# Patient Record
Sex: Male | Born: 1970 | Race: White | Hispanic: No | Marital: Married
Health system: Southern US, Community
[De-identification: ages and names within clinical notes are randomized; demographics above are authoritative.]

---

## 2020-06-08 ENCOUNTER — Other Ambulatory Visit: Payer: Self-pay | Admitting: Gastroenterology

## 2020-06-08 DIAGNOSIS — F1010 Alcohol abuse, uncomplicated: Secondary | ICD-10-CM

## 2020-06-08 DIAGNOSIS — R7401 Elevation of levels of liver transaminase levels: Secondary | ICD-10-CM

## 2020-06-15 ENCOUNTER — Ambulatory Visit
Admission: RE | Admit: 2020-06-15 | Discharge: 2020-06-15 | Disposition: A | Discharge status: Home / Self Care | Payer: BC Managed Care – PPO | Source: Ambulatory Visit | Attending: Gastroenterology | Admitting: Gastroenterology

## 2020-06-15 ENCOUNTER — Other Ambulatory Visit: Payer: Self-pay

## 2020-06-15 DIAGNOSIS — F1010 Alcohol abuse, uncomplicated: Secondary | ICD-10-CM | POA: Insufficient documentation

## 2020-06-15 DIAGNOSIS — R7401 Elevation of levels of liver transaminase levels: Secondary | ICD-10-CM | POA: Diagnosis present

## 2021-10-18 IMAGING — US US ABDOMEN LIMITED
1 series · 14 of 25 positions shown · non-contrast
Comparison: None.

CLINICAL DATA: Transaminitis, alcohol abuse.

EXAM:
ULTRASOUND ABDOMEN LIMITED RIGHT UPPER QUADRANT

[Series 1: us abdomen limited ruq (liver/gb) · 14 of 38 slices shown]
[im 1/38]
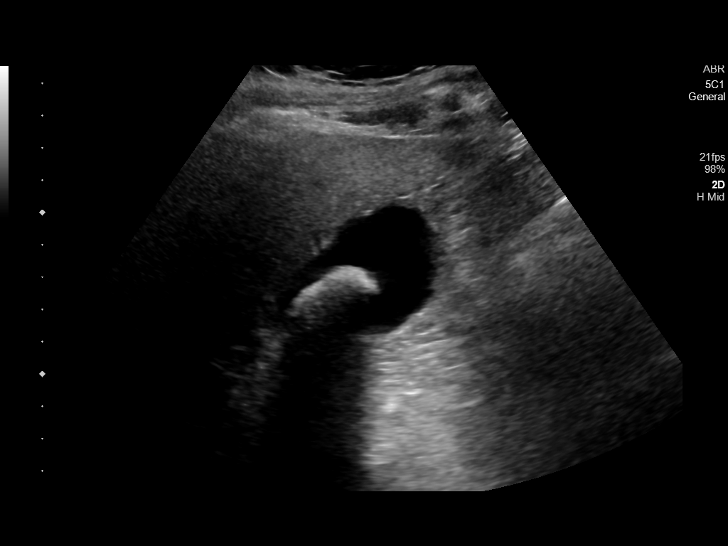
[im 4/38]
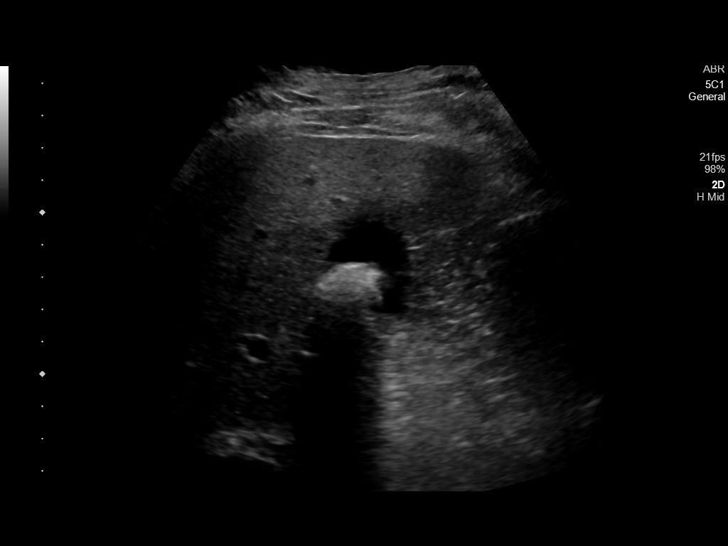
[im 7/38]
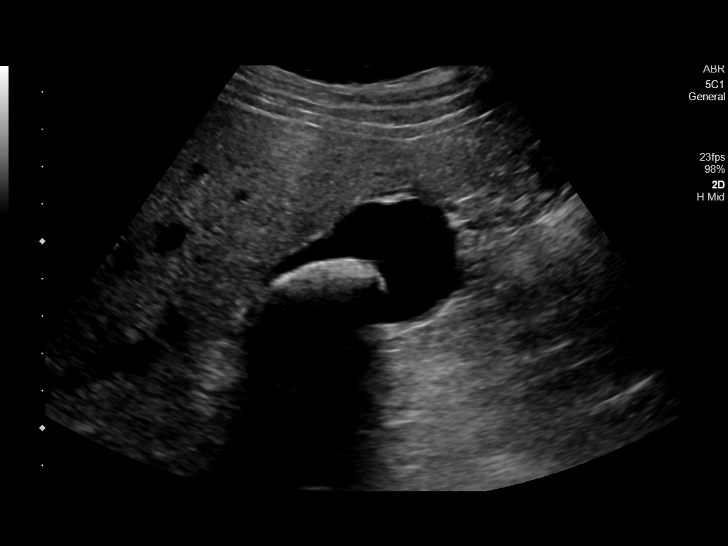
[im 10/38]
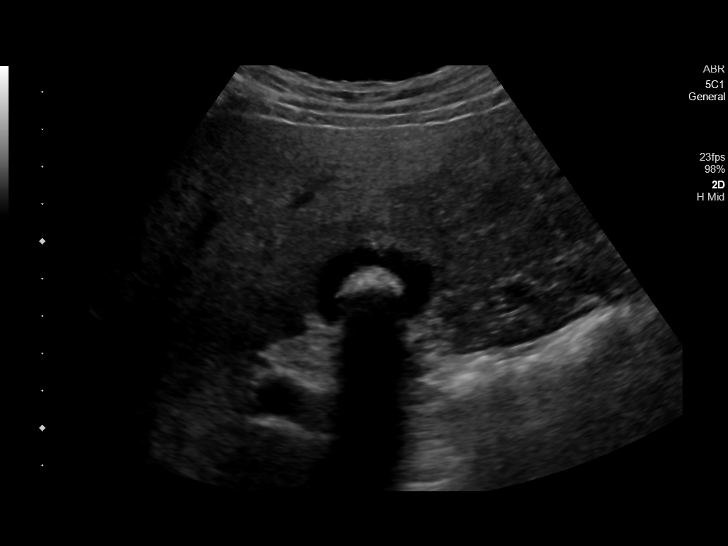
[im 13/38]
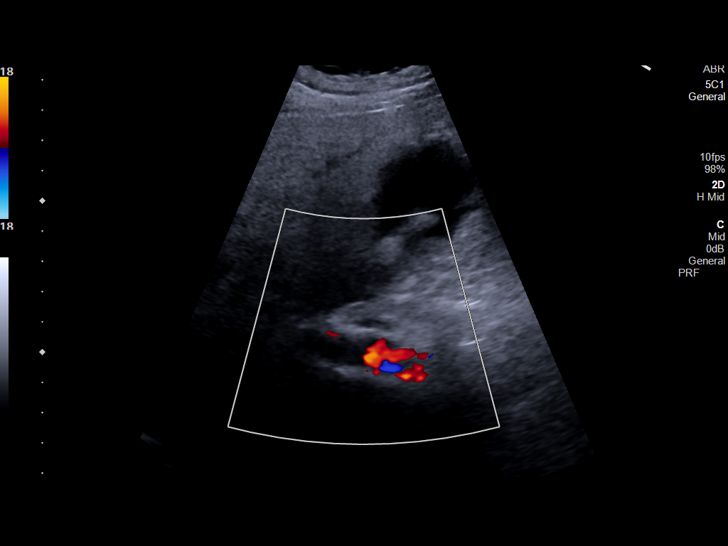
[im 14/38]
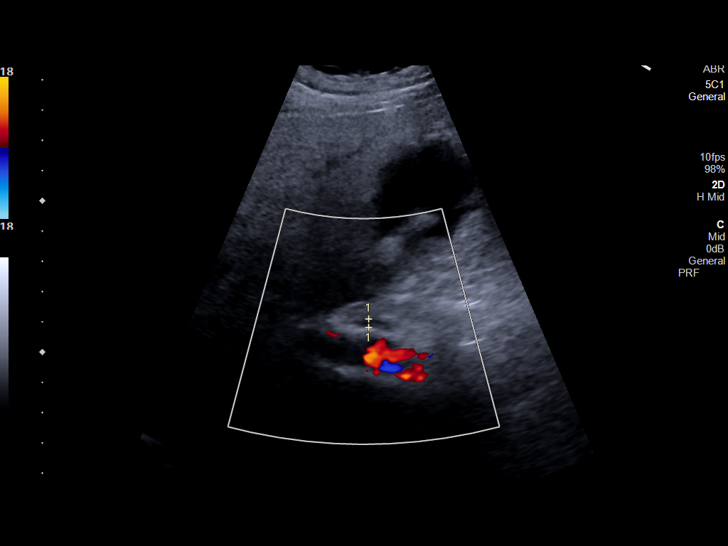
[im 17/38]
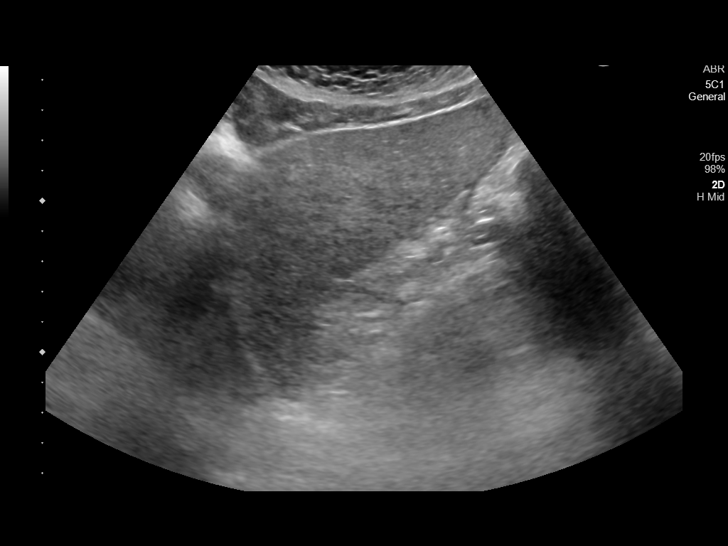
[im 21/38]
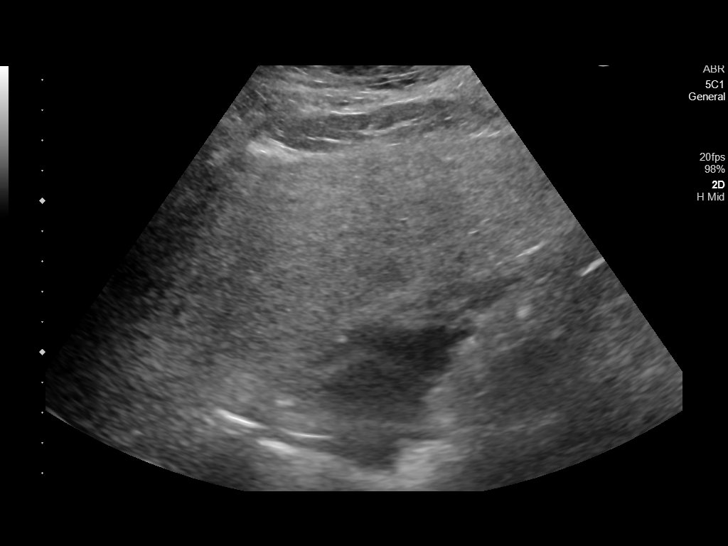
[im 24/38]
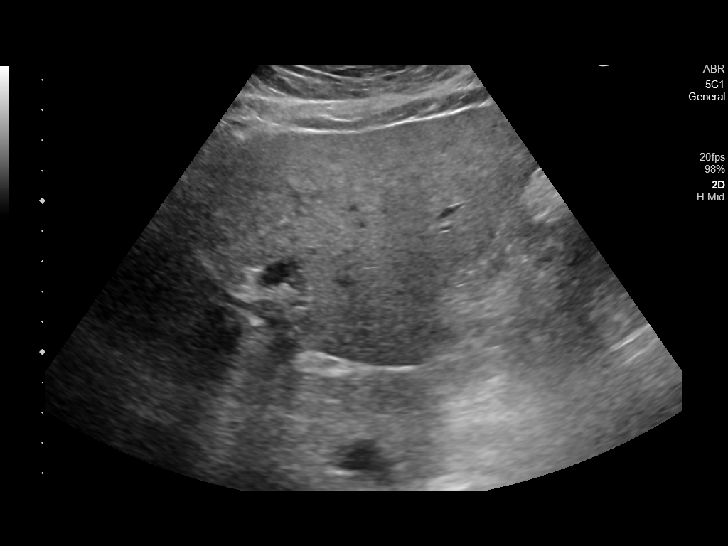
[im 25/38]
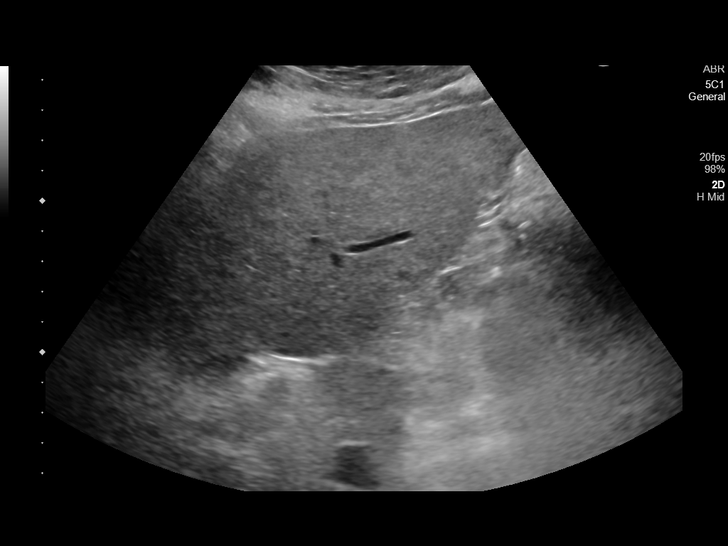
[im 28/38]
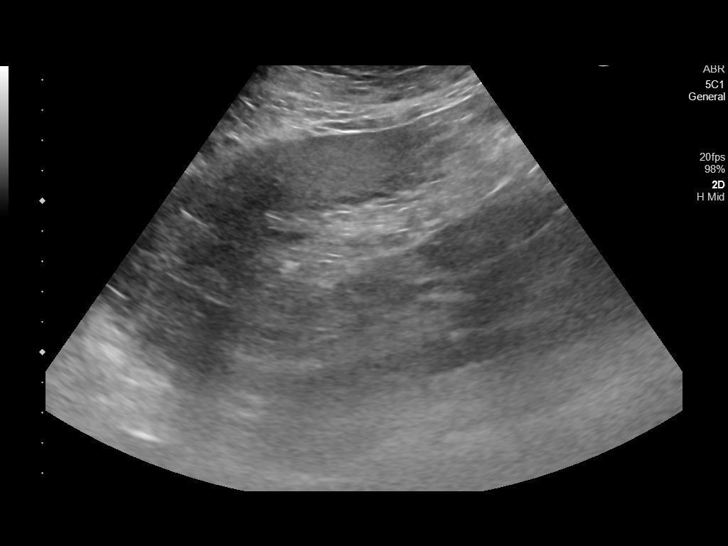
[im 31/38]
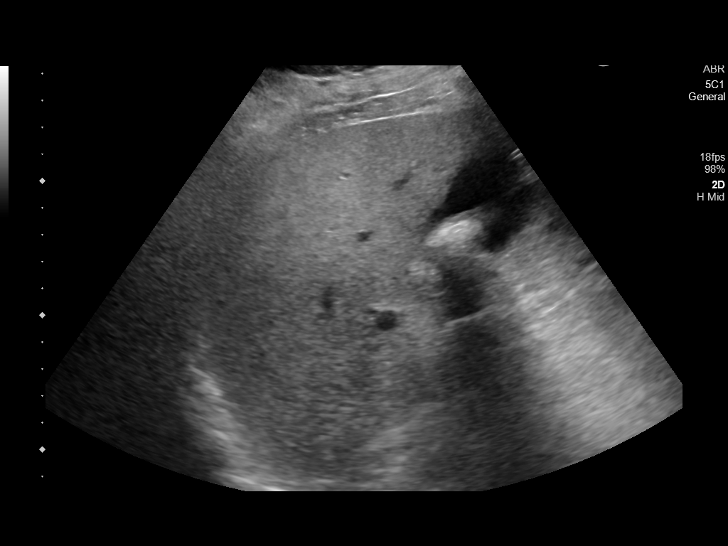
[im 34/38]
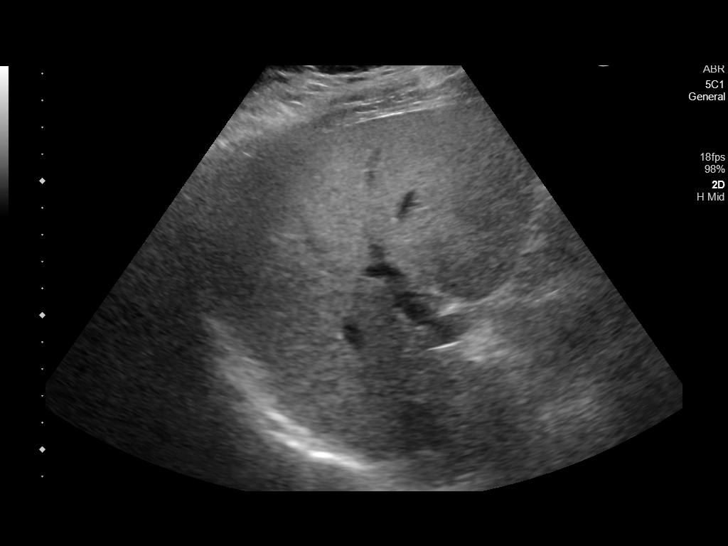
[im 38/38]
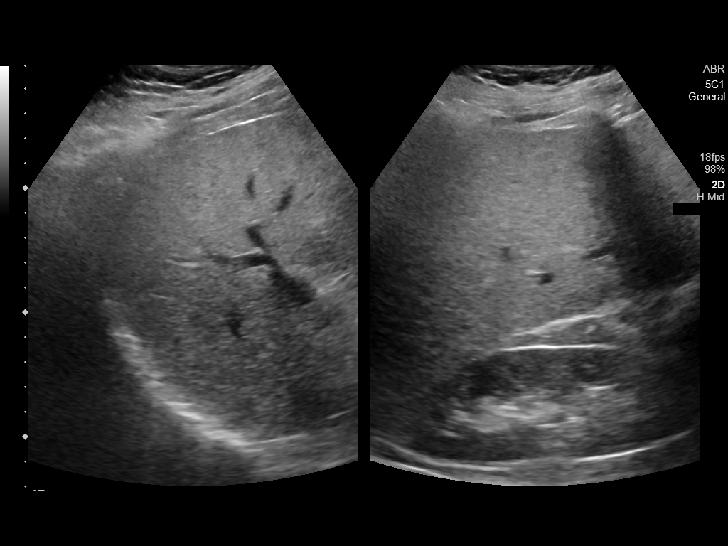

[14 of 25 positions shown; findings below may reference images not displayed]

FINDINGS: Gallbladder:

There is a single intraluminal calculus measuring 2.7 cm. No
gallbladder wall thickening or pericholecystic free fluid.
Sonographic Murphy sign was negative.

Common bile duct:

Diameter: 3 mm

Liver:

No focal lesion identified. Echogenic, heterogenous parenchyma.
Please note that ultrasound is limited in its sensitivity to
evaluate a heterogenous liver for focal lesions. Portal vein is
patent on color Doppler imaging with normal direction of blood flow
towards the liver.

Other: None.
IMPRESSION: Hepatic steatosis.  No focal hepatic lesion.

Cholelithiasis without cholecystitis.

## 2022-06-16 ENCOUNTER — Other Ambulatory Visit: Payer: Self-pay

## 2022-06-16 ENCOUNTER — Emergency Department: Payer: Managed Care, Other (non HMO)

## 2022-06-16 ENCOUNTER — Emergency Department
Admission: EM | Admit: 2022-06-16 | Discharge: 2022-06-16 | Disposition: A | Discharge status: Home / Self Care | Payer: Managed Care, Other (non HMO) | Attending: Emergency Medicine | Admitting: Emergency Medicine

## 2022-06-16 DIAGNOSIS — K8020 Calculus of gallbladder without cholecystitis without obstruction: Secondary | ICD-10-CM | POA: Diagnosis not present

## 2022-06-16 DIAGNOSIS — K766 Portal hypertension: Secondary | ICD-10-CM | POA: Diagnosis not present

## 2022-06-16 DIAGNOSIS — K7460 Unspecified cirrhosis of liver: Secondary | ICD-10-CM

## 2022-06-16 DIAGNOSIS — R1030 Lower abdominal pain, unspecified: Secondary | ICD-10-CM | POA: Diagnosis present

## 2022-06-16 DIAGNOSIS — K429 Umbilical hernia without obstruction or gangrene: Secondary | ICD-10-CM | POA: Diagnosis not present

## 2022-06-16 LAB — URINALYSIS, ROUTINE W REFLEX MICROSCOPIC
Bilirubin Urine: NEGATIVE
Glucose, UA: NEGATIVE mg/dL
Hgb urine dipstick: NEGATIVE
Ketones, ur: NEGATIVE mg/dL
Leukocytes,Ua: NEGATIVE
Nitrite: NEGATIVE
Protein, ur: NEGATIVE mg/dL
Specific Gravity, Urine: 1.006 (ref 1.005–1.030)
pH: 7 (ref 5.0–8.0)

## 2022-06-16 LAB — COMPREHENSIVE METABOLIC PANEL WITH GFR
ALT: 28 U/L (ref 0–44)
AST: 80 U/L — ABNORMAL HIGH (ref 15–41)
Albumin: 2.9 g/dL — ABNORMAL LOW (ref 3.5–5.0)
Alkaline Phosphatase: 99 U/L (ref 38–126)
Anion gap: 6 (ref 5–15)
BUN: 8 mg/dL (ref 6–20)
CO2: 21 mmol/L — ABNORMAL LOW (ref 22–32)
Calcium: 8.5 mg/dL — ABNORMAL LOW (ref 8.9–10.3)
Chloride: 108 mmol/L (ref 98–111)
Creatinine, Ser: 0.55 mg/dL — ABNORMAL LOW (ref 0.61–1.24)
GFR, Estimated: >60 mL/min
Glucose, Bld: 134 mg/dL — ABNORMAL HIGH (ref 70–99)
Potassium: 4.7 mmol/L (ref 3.5–5.1)
Sodium: 135 mmol/L (ref 135–145)
Total Bilirubin: 7.7 mg/dL — ABNORMAL HIGH (ref 0.3–1.2)
Total Protein: 8.3 g/dL — ABNORMAL HIGH (ref 6.5–8.1)

## 2022-06-16 LAB — CBC
HCT: 38.1 % — ABNORMAL LOW (ref 39.0–52.0)
Hemoglobin: 13.1 g/dL (ref 13.0–17.0)
MCH: 36.5 pg — ABNORMAL HIGH (ref 26.0–34.0)
MCHC: 34.4 g/dL (ref 30.0–36.0)
MCV: 106.1 fL — ABNORMAL HIGH (ref 80.0–100.0)
Platelets: 81 10*3/uL — ABNORMAL LOW (ref 150–400)
RBC: 3.59 MIL/uL — ABNORMAL LOW (ref 4.22–5.81)
RDW: 14.3 % (ref 11.5–15.5)
WBC: 4.8 10*3/uL (ref 4.0–10.5)
nRBC: 0 % (ref 0.0–0.2)

## 2022-06-16 LAB — PROTIME-INR
INR: 2.3 — ABNORMAL HIGH (ref 0.8–1.2)
Prothrombin Time: 24.9 seconds — ABNORMAL HIGH (ref 11.4–15.2)

## 2022-06-16 LAB — AMMONIA: Ammonia: 67 umol/L — ABNORMAL HIGH (ref 9–35)

## 2022-06-16 LAB — LIPASE, BLOOD: Lipase: 50 U/L (ref 11–51)

## 2022-06-16 MED ORDER — IOHEXOL 300 MG/ML  SOLN
100.0000 mL | Freq: Once | INTRAMUSCULAR | Status: AC | PRN
  Administered 2022-06-16: 100 mL via INTRAVENOUS

## 2022-06-16 MED ORDER — PANTOPRAZOLE SODIUM 40 MG PO TBEC: 40.0000 mg | DELAYED_RELEASE_TABLET | Freq: Every day | ORAL | 2 refills | Status: AC

## 2022-06-16 MED ORDER — PANTOPRAZOLE SODIUM 40 MG PO TBEC: 40.0000 mg | DELAYED_RELEASE_TABLET | Freq: Every day | ORAL | 2 refills | Status: DC

## 2022-06-16 NOTE — ED Provider Notes (Signed)
[facility] Provider Note    Event Date/Time   First MD Initiated Contact with Patient 06/16/22 [id number]     (approximate)   History   Chief Complaint: Jaundice   HPI  Corey Lin is a 51 y.o. male with no known past medical history who comes the ED complaining of jaundice that has been gradually progressing for the past few weeks.  Also complains of some pain from an umbilical hernia which sometimes radiates down toward his groin.  No vomiting or diarrhea.  Normal oral intake.  He is a daily drinker about 4 drinks a day of liquor.  Denies any withdrawal symptoms in the past.  No black or bloody stool or hematemesis.  No fever.     Physical Exam   Triage Vital Signs: ED Triage Vitals  Enc Vitals Group     BP 06/16/22 0843 (!) 149/92     Pulse Rate 06/16/22 0843 75     Resp 06/16/22 0843 18     Temp 06/16/22 0843 98 F (36.7 C)     Temp Source 06/16/22 0843 Oral     SpO2 06/16/22 0843 98 %     Weight 06/16/22 0844 210 lb (95.3 kg)     Height 06/16/22 0844 5\' 10"  (1.778 m)     Head Circumference --      Peak Flow --      Pain Score 06/16/22 0842 0     Pain Loc --      Pain Edu? --      Excl. in GC? --     Most recent vital signs: Vitals:   06/16/22 1430 06/16/22 1433  BP: 132/77   Pulse: 74   Resp:    Temp:  98.2 F (36.8 C)  SpO2: 95%     General: Awake, no distress.  CV:  Good peripheral perfusion.  Regular rate and rhythm, normal peripheral pulses Resp:  Normal effort.  Clear to auscultation bilaterally Abd:  No distention.  Soft and nontender.  There is a 2 cm umbilical hernia which is easily reducible and nontender. Other:  No lower extremity edema.  There is mild scleral icterus.   ED Results / Procedures / Treatments   Labs (all labs ordered are listed, but only abnormal results are displayed) Labs Reviewed  COMPREHENSIVE METABOLIC PANEL - Abnormal; Notable for the following components:      Result Value   CO2 21 (*)     Glucose, Bld 134 (*)    Creatinine, Ser 0.55 (*)    Calcium 8.5 (*)    Total Protein 8.3 (*)    Albumin 2.9 (*)    AST 80 (*)    Total Bilirubin 7.7 (*)    All other components within normal limits  CBC - Abnormal; Notable for the following components:   RBC 3.59 (*)    HCT 38.1 (*)    MCV 106.1 (*)    MCH 36.5 (*)    Platelets 81 (*)    All other components within normal limits  URINALYSIS, ROUTINE W REFLEX MICROSCOPIC - Abnormal; Notable for the following components:   Color, Urine YELLOW (*)    APPearance CLEAR (*)    All other components within normal limits  AMMONIA - Abnormal; Notable for the following components:   Ammonia 67 (*)    All other components within normal limits  PROTIME-INR - Abnormal; Notable for the following components:   Prothrombin Time 24.9 (*)    INR 2.3 (*)  All other components within normal limits  HEPATITIS PANEL, ACUTE - Abnormal; Notable for the following components:   HCV Ab Reactive (*)    All other components within normal limits  LIPASE, BLOOD     EKG    RADIOLOGY [place] abd RUQ interpreted by me, shows cholelithiasis without evidence of cholecystitis or biliary obstruction.  Radiology report reviewed, suspicious for cirrhosis  CT abdomen pelvis unremarkable except for signs of cirrhosis.  No ascites   PROCEDURES:  Procedures   MEDICATIONS ORDERED IN ED: Medications  iohexol (OMNIPAQUE) 300 MG/ML solution 100 mL (100 mLs Intravenous Contrast Given 06/16/22 1212)     IMPRESSION / MDM / ASSESSMENT AND PLAN / ED COURSE  I reviewed the triage vital signs and the nursing notes.                              Differential diagnosis includes, but is not limited to, choledocholithiasis, pancreatic mass, hepatitis, cirrhosis  Patient's presentation is most consistent with acute presentation with potential threat to life or bodily function.  Patient presents with jaundice without other significant symptoms.  Lab panel shows low  albumin of 2.9, T. bili of 7.7.  Transaminases are essentially normal.  CBC unremarkable except for MCV of 106 and platelets of 81, likely due to chronic alcoholism.  We will obtain ultrasound right upper quadrant, possibly need CT to evaluate for pancreatic head mass.  We will check PT/INR as a marker of liver function.  Vitals are unremarkable, no signs of acute infectious process.  Case d/w GI Dr. Allen Norris who advises pt has established card with outpatient GI, and if imaging does not show acute issues, he can continue follow up.  Labs and imaging c/w cirrhosis without other acute findings. No evidence of bleeding or encephalopathy.      FINAL CLINICAL IMPRESSION(S) / ED DIAGNOSES   Final diagnoses:  Cirrhosis of liver without ascites, unspecified hepatic cirrhosis type ([place])  Portal hypertension ([place])     Rx / DC Orders   ED Discharge Orders          Ordered    pantoprazole (PROTONIX) 40 MG tablet  Daily,   Status:  Discontinued        06/16/22 1424    pantoprazole (PROTONIX) 40 MG tablet  Daily        06/16/22 1435             Note:  This document was prepared using Dragon voice recognition software and may include unintentional dictation errors.   Carrie Mew, MD 06/18/22 [phone]

## 2022-06-16 NOTE — Discharge Instructions (Addendum)
Call the mental health/substance abuse phone number on your health insurance card for help with alcohol treatment services.

## 2022-06-16 NOTE — ED Triage Notes (Signed)
Pt comes with c/o jaundice. Pt states some lower belly pain. Pt states week ago it started. Pt state he does drink daily sometimes not so much. Pt has not been dx with liver failure.  Pt appears yellow. Pt denies any vomiting.

## 2022-06-17 LAB — HEPATITIS PANEL, ACUTE
HCV Ab: REACTIVE — AB
Hep A IgM: NONREACTIVE
Hep B C IgM: NONREACTIVE
Hepatitis B Surface Ag: NONREACTIVE
